# Patient Record
Sex: Male | Born: 1990 | Race: Black or African American | Hispanic: No | Marital: Single | Smoking: Current every day smoker
Health system: Southern US, Community
[De-identification: ages and names within clinical notes are randomized; demographics above are authoritative.]

---
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
## 2009-07-19 ENCOUNTER — Ambulatory Visit: Payer: Self-pay | Admitting: Internal Medicine

## 2009-07-19 LAB — CONVERTED CEMR LAB
ALT: 20 units/L (ref 0–53)
CO2: 31 meq/L (ref 19–32)
Calcium: 9.7 mg/dL (ref 8.4–10.5)
Cholesterol: 209 mg/dL — ABNORMAL HIGH (ref 0–200)
Creatinine, Ser: 1 mg/dL (ref 0.4–1.5)
Direct LDL: 111.3 mg/dL
Eosinophils Relative: 1.4 % (ref 0.0–5.0)
GFR calc non Af Amer: 126.31 mL/min (ref >60)
HCT: 42.5 % (ref 39.0–52.0)
Lymphs Abs: 1.4 10*3/uL (ref 0.7–4.0)
MCV: 88.4 fL (ref 78.0–100.0)
Monocytes Absolute: 0.4 10*3/uL (ref 0.1–1.0)
Platelets: 185 10*3/uL (ref 150.0–400.0)
TSH: 0.47 microintl units/mL (ref 0.35–5.50)
Total Bilirubin: 1 mg/dL (ref 0.3–1.2)
Total Protein: 7.3 g/dL (ref 6.0–8.3)
Triglycerides: 49 mg/dL (ref 0.0–149.0)
WBC: 3.8 10*3/uL — ABNORMAL LOW (ref 4.5–10.5)

## 2009-07-23 ENCOUNTER — Ambulatory Visit: Payer: Self-pay | Admitting: Internal Medicine

## 2010-04-14 NOTE — Assessment & Plan Note (Signed)
Summary: CPX/[facility]/#/CD   Vital Signs:  Patient profile:   20 year old male Weight:      158.75 pounds O2 Sat:      97 % on Room air Temp:     98.4 degrees F oral Pulse rate:   94 / minute BP sitting:   90 / 60  (left arm) Cuff size:   regular  Vitals Entered By: Lucious Groves (Jul 23, 2009 2:09 PM)  O2 Flow:  Room air CC: CPX./kb Is Patient Diabetic? No Pain Assessment Patient in pain? no        CC:  CPX./kb.  History of Present Illness: The patient presents for a wellness examination   Preventive Screening-Counseling & Management  Alcohol-Tobacco     Smoking Status: current  Current Medications (verified): 1)  None  Allergies (verified): 1)  ! Penicillin V Potassium (Penicillin V Potassium)  Past History:  Past Medical History: Last updated: 12/22/2006 hyperlipidemia  Past Surgical History: Denies surgical history  Family History: mom - dm, hypertension , fms  Social History: Occupation: temp jobs; Radiographer, therapeutic Single Current Smoker Alcohol use-yes Smoking Status:  current  Review of Systems  The patient denies anorexia, fever, weight loss, weight gain, vision loss, decreased hearing, hoarseness, chest pain, syncope, dyspnea on exertion, peripheral edema, prolonged cough, headaches, hemoptysis, abdominal pain, melena, hematochezia, severe indigestion/heartburn, hematuria, incontinence, genital sores, muscle weakness, suspicious skin lesions, transient blindness, difficulty walking, depression, unusual weight change, abnormal bleeding, enlarged lymph nodes, angioedema, and breast masses.         Lost a lot of wt on diet  Physical Exam  General:  Well-developed,well-nourished,in no acute distress; alert,appropriate and cooperative throughout examination Head:  Normocephalic and atraumatic without obvious abnormalities. No apparent alopecia or balding. Eyes:  No corneal or conjunctival inflammation noted. EOMI. Perrla. Ears:  External  ear exam shows no significant lesions or deformities.  Otoscopic examination reveals clear canals, tympanic membranes are intact bilaterally without bulging, retraction, inflammation or discharge. Hearing is grossly normal bilaterally. Nose:  External nasal examination shows no deformity or inflammation. Nasal mucosa are pink and moist without lesions or exudates. Mouth:  Oral mucosa and oropharynx without lesions or exudates.  Teeth in good repair. Neck:  No deformities, masses, or tenderness noted. Lungs:  Normal respiratory effort, chest expands symmetrically. Lungs are clear to auscultation, no crackles or wheezes. Heart:  Normal rate and regular rhythm. S1 and S2 normal without gallop, murmur, click, rub or other extra sounds. Abdomen:  Bowel sounds positive,abdomen soft and non-tender without masses, organomegaly or hernias noted. Genitalia:  Declined. Self exam nl Msk:  No deformity or scoliosis noted of thoracic or lumbar spine.   Pulses:  R and L carotid,radial,femoral,dorsalis pedis and posterior tibial pulses are full and equal bilaterally Extremities:  No clubbing, cyanosis, edema, or deformity noted with normal full range of motion of all joints.   Neurologic:  No cranial nerve deficits noted. Station and gait are normal. Plantar reflexes are down-going bilaterally. DTRs are symmetrical throughout. Sensory, motor and coordinative functions appear intact. Skin:  Intact without suspicious lesions or rashes Striae on trunk Cervical Nodes:  No lymphadenopathy noted Inguinal Nodes:  No significant adenopathy Psych:  Cognition and judgment appear intact. Alert and cooperative with normal attention span and concentration. No apparent delusions, illusions, hallucinations   Impression & Recommendations:  Problem # 1:  PHYSICAL EXAMINATION (ICD-V70.0) Assessment New Health and age related issues were discussed. Available screening tests and vaccinations were discussed as well.  Healthy life  style including good diet and execise was discussed. Safe sex/seatbelt use discussed.  The labs were reviewed with the patient.   Complete Medication List: 1)  Vitamin D 1000 Unit Tabs (Cholecalciferol) .Marland Kitchen.. 1 by mouth qd  Patient Instructions: 1)  Please schedule a follow-up appointment in 1 year well w/labs.

## 2010-09-23 ENCOUNTER — Emergency Department (HOSPITAL_COMMUNITY)
Admission: EM | Admit: 2010-09-23 | Discharge: 2010-09-23 | Discharge status: Against Medical Advice | Payer: Self-pay | Attending: Emergency Medicine | Admitting: Emergency Medicine

## 2010-09-23 DIAGNOSIS — X58XXXA Exposure to other specified factors, initial encounter: Secondary | ICD-10-CM | POA: Insufficient documentation

## 2010-09-23 DIAGNOSIS — S61509A Unspecified open wound of unspecified wrist, initial encounter: Secondary | ICD-10-CM | POA: Insufficient documentation

## 2012-03-18 ENCOUNTER — Ambulatory Visit: Payer: Self-pay | Admitting: Internal Medicine

## 2012-03-18 DIAGNOSIS — Z0289 Encounter for other administrative examinations: Secondary | ICD-10-CM

## 2014-01-29 ENCOUNTER — Telehealth: Payer: Self-pay | Admitting: Internal Medicine

## 2014-01-29 NOTE — Telephone Encounter (Signed)
Mother is requesting you to take her son back on as a patient.  He was last seen in 2011.  He Had an appt on 03/18/2012 but he no showed that time.

## 2014-01-29 NOTE — Telephone Encounter (Signed)
Ok thx.

## 2014-01-29 NOTE — Telephone Encounter (Signed)
Got patient scheduled for 11/23 at 3:15 for cpe

## 2014-02-02 ENCOUNTER — Ambulatory Visit (INDEPENDENT_AMBULATORY_CARE_PROVIDER_SITE_OTHER): Payer: 59 | Admitting: Internal Medicine

## 2014-02-02 ENCOUNTER — Encounter: Payer: Self-pay | Admitting: Internal Medicine

## 2014-02-02 VITALS — BP 120/80 | HR 92 | Temp 98.4°F | Ht 69.0 in | Wt 179.0 lb

## 2014-02-02 DIAGNOSIS — Z0000 Encounter for general adult medical examination without abnormal findings: Secondary | ICD-10-CM | POA: Insufficient documentation

## 2014-02-02 NOTE — Progress Notes (Signed)
Pre visit review using our clinic review tool, if applicable. No additional management support is needed unless otherwise documented below in the visit note. 
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
## 2014-02-02 NOTE — Assessment & Plan Note (Signed)
We discussed age appropriate health related issues, including available/recomended screening tests and vaccinations. We discussed a need for adhering to healthy diet and exercise. Labs/EKG were reviewed/ordered. All questions were answered.  He had a flu Labs

## 2014-10-26 ENCOUNTER — Ambulatory Visit (INDEPENDENT_AMBULATORY_CARE_PROVIDER_SITE_OTHER): Payer: 59 | Admitting: Internal Medicine

## 2014-10-26 ENCOUNTER — Other Ambulatory Visit (INDEPENDENT_AMBULATORY_CARE_PROVIDER_SITE_OTHER): Payer: 59

## 2014-10-26 ENCOUNTER — Encounter: Payer: Self-pay | Admitting: Internal Medicine

## 2014-10-26 VITALS — BP 108/70 | HR 91 | Temp 98.0°F | Ht 69.0 in | Wt 168.1 lb

## 2014-10-26 DIAGNOSIS — Z0000 Encounter for general adult medical examination without abnormal findings: Secondary | ICD-10-CM | POA: Diagnosis not present

## 2014-10-26 DIAGNOSIS — R21 Rash and other nonspecific skin eruption: Secondary | ICD-10-CM

## 2014-10-26 LAB — URINALYSIS
Bilirubin Urine: NEGATIVE
Hgb urine dipstick: NEGATIVE
Ketones, ur: NEGATIVE
Leukocytes, UA: NEGATIVE
Nitrite: NEGATIVE
PH: 6.5 (ref 5.0–8.0)
TOTAL PROTEIN, URINE-UPE24: NEGATIVE
URINE GLUCOSE: NEGATIVE
Urobilinogen, UA: 0.2 (ref 0.0–1.0)

## 2014-10-26 LAB — LIPID PANEL
CHOL/HDL RATIO: 2
Cholesterol: 188 mg/dL (ref 0–200)
HDL: 81.8 mg/dL (ref >39.00)
LDL CALC: 67 mg/dL (ref 0–99)
NonHDL: 106.23
TRIGLYCERIDES: 197 mg/dL — AB (ref 0.0–149.0)
VLDL: 39.4 mg/dL (ref 0.0–40.0)

## 2014-10-26 LAB — CBC WITH DIFFERENTIAL/PLATELET
BASOS ABS: 0 10*3/uL (ref 0.0–0.1)
Basophils Relative: 0.7 % (ref 0.0–3.0)
EOS ABS: 0.1 10*3/uL (ref 0.0–0.7)
Eosinophils Relative: 1.6 % (ref 0.0–5.0)
HCT: 45.6 % (ref 39.0–52.0)
Hemoglobin: 15 g/dL (ref 13.0–17.0)
LYMPHS ABS: 1.5 10*3/uL (ref 0.7–4.0)
Lymphocytes Relative: 28.2 % (ref 12.0–46.0)
MCHC: 33 g/dL (ref 30.0–36.0)
MCV: 91.3 fl (ref 78.0–100.0)
MONOS PCT: 9.8 % (ref 3.0–12.0)
Monocytes Absolute: 0.5 10*3/uL (ref 0.1–1.0)
NEUTROS ABS: 3.2 10*3/uL (ref 1.4–7.7)
NEUTROS PCT: 59.7 % (ref 43.0–77.0)
PLATELETS: 184 10*3/uL (ref 150.0–400.0)
RBC: 5 Mil/uL (ref 4.22–5.81)
RDW: 14.6 % (ref 11.5–15.5)
WBC: 5.4 10*3/uL (ref 4.0–10.5)

## 2014-10-26 LAB — HEPATIC FUNCTION PANEL
ALT: 29 U/L (ref 0–53)
AST: 22 U/L (ref 0–37)
Albumin: 4.7 g/dL (ref 3.5–5.2)
Alkaline Phosphatase: 65 U/L (ref 39–117)
BILIRUBIN DIRECT: 0.1 mg/dL (ref 0.0–0.3)
Total Bilirubin: 0.5 mg/dL (ref 0.2–1.2)
Total Protein: 7.5 g/dL (ref 6.0–8.3)

## 2014-10-26 LAB — BASIC METABOLIC PANEL
BUN: 16 mg/dL (ref 6–23)
CHLORIDE: 104 meq/L (ref 96–112)
CO2: 31 mEq/L (ref 19–32)
CREATININE: 1.24 mg/dL (ref 0.40–1.50)
Calcium: 9.4 mg/dL (ref 8.4–10.5)
GFR: 90.81 mL/min (ref >60.00)
Glucose, Bld: 57 mg/dL — ABNORMAL LOW (ref 70–99)
Potassium: 4 mEq/L (ref 3.5–5.1)
Sodium: 142 mEq/L (ref 135–145)

## 2014-10-26 LAB — TSH: TSH: 0.24 u[IU]/mL — ABNORMAL LOW (ref 0.35–4.50)

## 2014-10-26 MED ORDER — TRIAMCINOLONE ACETONIDE 0.1 % EX CREA: 1.0000 "application " | TOPICAL_CREAM | Freq: Two times a day (BID) | CUTANEOUS | Status: AC

## 2014-10-26 MED ORDER — VITAMIN D 1000 UNITS PO TABS: 1000.0000 [IU] | ORAL_TABLET | Freq: Every day | ORAL | Status: AC

## 2014-10-26 NOTE — Patient Instructions (Addendum)
Safe Sex Safe sex is about reducing the risk of giving or getting a sexually transmitted disease (STD). STDs are spread through sexual contact involving the genitals, mouth, or rectum. Some STDs can be cured and others cannot. Safe sex can also prevent unintended pregnancies.  WHAT ARE SOME SAFE SEX PRACTICES?  Limit your sexual activity to only one partner who is having sex with only you.  Talk to your partner about his or her past partners, past STDs, and drug use.  Use a condom every time you have sexual intercourse. This includes vaginal, oral, and anal sexual activity. Both females and males should wear condoms during oral sex. Only use latex or polyurethane condoms and water-based lubricants. Using petroleum-based lubricants or oils to lubricate a condom will weaken the condom and increase the chance that it will break. The condom should be in place from the beginning to the end of sexual activity. Wearing a condom reduces, but does not completely eliminate, your risk of getting or giving an STD. STDs can be spread by contact with infected body fluids and skin.  Get vaccinated for hepatitis B and HPV.  Avoid alcohol and recreational drugs, which can affect your judgment. You may forget to use a condom or participate in high-risk sex.  For females, avoid douching after sexual intercourse. Douching can spread an infection farther into the reproductive tract.  Check your body for signs of sores, blisters, rashes, or unusual discharge. See your health care provider if you notice any of these signs.  Avoid sexual contact if you have symptoms of an infection or are being treated for an STD. If you or your partner has herpes, avoid sexual contact when blisters are present. Use condoms at all other times.  If you are at risk of being infected with HIV, it is recommended that you take a prescription medicine daily to prevent HIV infection. This is called pre-exposure prophylaxis (PrEP). You are  considered at risk if:  You are a man who has sex with other men (MSM).  You are a heterosexual man or woman who is sexually active with more than one partner.  You take drugs by injection.  You are sexually active with a partner who has HIV.  Talk with your health care provider about whether you are at high risk of being infected with HIV. If you choose to begin PrEP, you should first be tested for HIV. You should then be tested every 3 months for as long as you are taking PrEP.  See your health care provider for regular screenings, exams, and tests for other STDs. Before having sex with a new partner, each of you should be screened for STDs and should talk about the results with each other. WHAT ARE THE BENEFITS OF SAFE SEX?   There is less chance of getting or giving an STD.  You can prevent unwanted or unintended pregnancies.  By discussing safe sex concerns with your partner, you may increase feelings of intimacy, comfort, trust, and honesty between the two of you. Document Released: 04/06/2004 Document Revised: 07/14/2013 Document Reviewed: 08/21/2011 [facility] Patient Information 2015 [place], [place]. This information is not intended to replace advice given to you by your health care provider. Make sure you discuss any questions you have with your health care provider.     Probable pytiriasis rosea

## 2014-10-26 NOTE — Progress Notes (Signed)
Pre visit review using our clinic review tool, if applicable. No additional management support is needed unless otherwise documented below in the visit note. 

## 2014-10-26 NOTE — Assessment & Plan Note (Signed)
We discussed age appropriate health related issues, including available/recomended screening tests and vaccinations. We discussed a need for adhering to healthy diet and exercise. Labs/EKG were reviewed/ordered. All questions were answered.  Labs Safe sex
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
## 2014-10-26 NOTE — Assessment & Plan Note (Signed)
8/16 Probable pytiriasis rosea Kenalog cream

## 2014-10-27 LAB — HIV ANTIBODY (ROUTINE TESTING W REFLEX): HIV 1&2 Ab, 4th Generation: NONREACTIVE

## 2014-10-27 LAB — RPR

## 2014-10-28 ENCOUNTER — Telehealth: Payer: Self-pay

## 2014-10-28 NOTE — Telephone Encounter (Signed)
[facility] called today and needed pt to bring in the urine sample that he did at home (per [facility] request).  I looked at the lab orders and all of the labs requiring urine were collected and results.  Asked [facility]e [facility] to call [facility] back and confirm what they needed.

## 2015-05-25 ENCOUNTER — Telehealth: Payer: Self-pay

## 2015-05-25 NOTE — Telephone Encounter (Signed)
LVM for pt to call back as soon as possible.   RE: Flu Vaccine 2016-2017 

## 2019-02-01 ENCOUNTER — Emergency Department (HOSPITAL_BASED_OUTPATIENT_CLINIC_OR_DEPARTMENT_OTHER)
Admission: EM | Admit: 2019-02-01 | Discharge: 2019-02-01 | Disposition: A | Discharge status: Home / Self Care | Payer: Self-pay | Attending: Emergency Medicine | Admitting: Emergency Medicine

## 2019-02-01 ENCOUNTER — Emergency Department (HOSPITAL_BASED_OUTPATIENT_CLINIC_OR_DEPARTMENT_OTHER): Payer: Self-pay

## 2019-02-01 ENCOUNTER — Other Ambulatory Visit: Payer: Self-pay

## 2019-02-01 ENCOUNTER — Encounter (HOSPITAL_BASED_OUTPATIENT_CLINIC_OR_DEPARTMENT_OTHER): Payer: Self-pay | Admitting: *Deleted

## 2019-02-01 DIAGNOSIS — W1830XA Fall on same level, unspecified, initial encounter: Secondary | ICD-10-CM | POA: Insufficient documentation

## 2019-02-01 DIAGNOSIS — Y929 Unspecified place or not applicable: Secondary | ICD-10-CM | POA: Insufficient documentation

## 2019-02-01 DIAGNOSIS — Y939 Activity, unspecified: Secondary | ICD-10-CM | POA: Insufficient documentation

## 2019-02-01 DIAGNOSIS — Z79899 Other long term (current) drug therapy: Secondary | ICD-10-CM | POA: Insufficient documentation

## 2019-02-01 DIAGNOSIS — F17200 Nicotine dependence, unspecified, uncomplicated: Secondary | ICD-10-CM | POA: Insufficient documentation

## 2019-02-01 DIAGNOSIS — Y999 Unspecified external cause status: Secondary | ICD-10-CM | POA: Insufficient documentation

## 2019-02-01 DIAGNOSIS — S0101XA Laceration without foreign body of scalp, initial encounter: Secondary | ICD-10-CM | POA: Insufficient documentation

## 2019-02-01 DIAGNOSIS — S60221A Contusion of right hand, initial encounter: Secondary | ICD-10-CM | POA: Insufficient documentation

## 2019-02-01 MED ORDER — LIDOCAINE-PRILOCAINE 2.5-2.5 % EX CREA
TOPICAL_CREAM | Freq: Once | CUTANEOUS | Status: DC
  Filled 2019-02-01: qty 5

## 2019-02-01 MED ORDER — ACETAMINOPHEN 500 MG PO TABS
1000.0000 mg | ORAL_TABLET | Freq: Once | ORAL | Status: AC
  Administered 2019-02-01: 1000 mg via ORAL
  Filled 2019-02-01: qty 2

## 2019-02-01 MED ORDER — IBUPROFEN 800 MG PO TABS
800.0000 mg | ORAL_TABLET | Freq: Once | ORAL | Status: AC
  Administered 2019-02-01: 800 mg via ORAL
  Filled 2019-02-01: qty 1

## 2019-02-01 MED ORDER — LIDOCAINE-EPINEPHRINE-TETRACAINE (LET) TOPICAL GEL
TOPICAL | Status: AC
  Administered 2019-02-01: 3 mL
  Filled 2019-02-01: qty 3
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
## 2019-02-01 NOTE — ED Notes (Signed)
Patient transported to CT 
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
